# Patient Record
Sex: Female | Born: 1950 | Hispanic: Yes
Health system: Western US, Academic
[De-identification: ages and names within clinical notes are randomized; demographics above are authoritative.]

## PROBLEM LIST (undated history)

## (undated) DIAGNOSIS — Z87898 Personal history of other specified conditions: Secondary | ICD-10-CM

## (undated) DIAGNOSIS — E119 Type 2 diabetes mellitus without complications: Secondary | ICD-10-CM

## (undated) DIAGNOSIS — I10 Essential (primary) hypertension: Secondary | ICD-10-CM

## (undated) DIAGNOSIS — F259 Schizoaffective disorder, unspecified: Secondary | ICD-10-CM

## (undated) DIAGNOSIS — E7800 Pure hypercholesterolemia, unspecified: Secondary | ICD-10-CM

## (undated) HISTORY — DX: Anxiety disorder, unspecified: F41.9

## (undated) HISTORY — DX: Personal history of other specified conditions: Z87.898

## (undated) HISTORY — DX: Schizoaffective disorder, unspecified (CMS-HCC): F25.9

---

## 2014-07-24 ENCOUNTER — Emergency Department: Admission: EM | Admit: 2014-07-24 | Payer: Self-pay | Admitting: Emergency Medicine

## 2020-10-05 IMAGING — MR MRI LSPINE WO CONTRAST
5 of 6 series · 28 of 48 positions shown · non-contrast
Comparison: Radiographs dated 10/05/2020

HISTORY: Neuralgia, neuritis
TECHNIQUE: Routine multiplanar MRI of the lumbar spine was performed without IV contrast.

[Series 2: t2_cor · coronal · 4.0mm · 0.88mm/px · 6 of 16 slices shown]
[im 1/16]
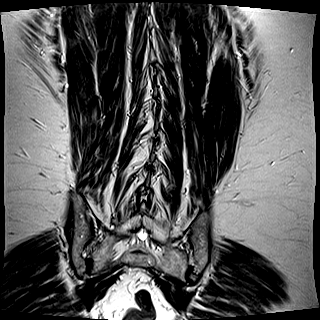
[im 4/16]
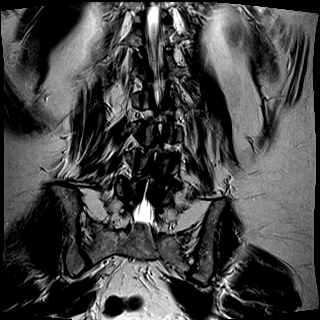
[im 7/16]
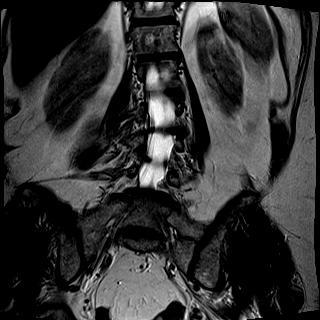
[im 10/16]
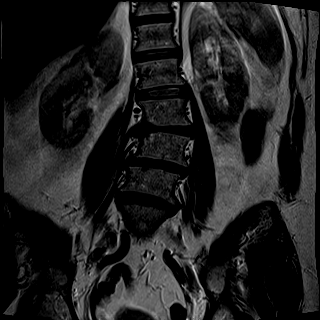
[im 13/16]
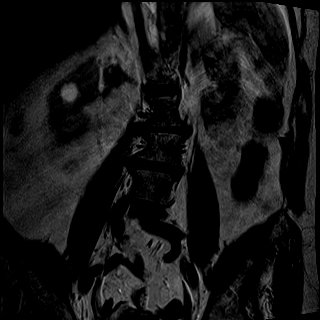
[im 16/16]
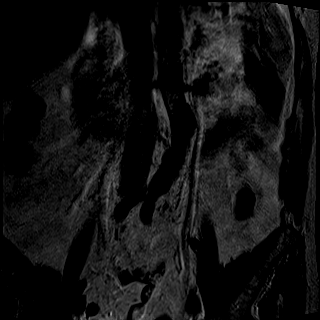

[Series 3: t2_sag · sagittal · 4.0mm · 0.68mm/px · 6 of 19 slices shown]
[im 1/19]
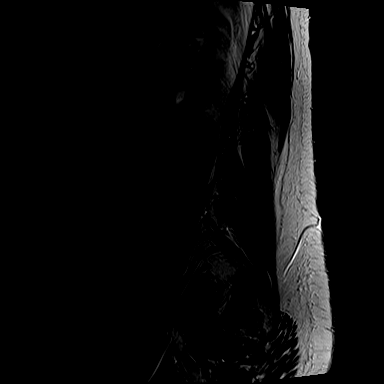
[im 4/19]
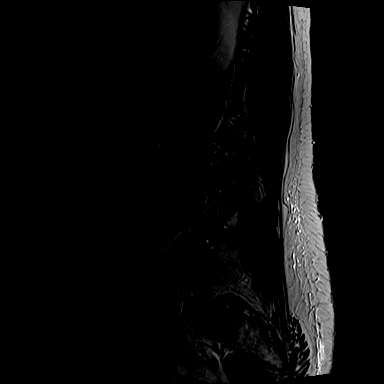
[im 8/19]
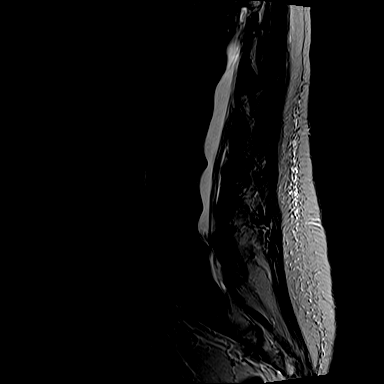
[im 11/19]
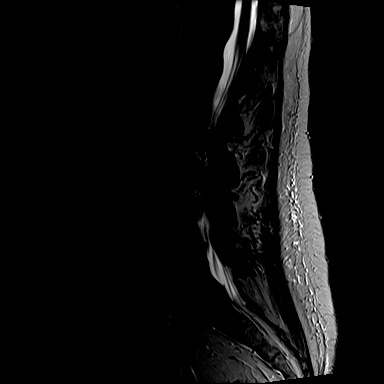
[im 15/19]
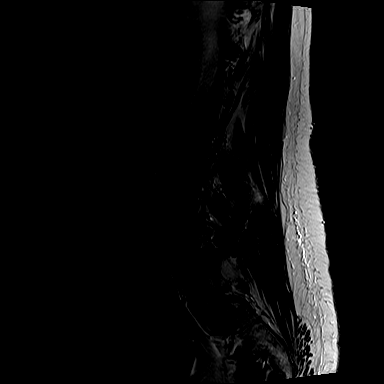
[im 19/19]
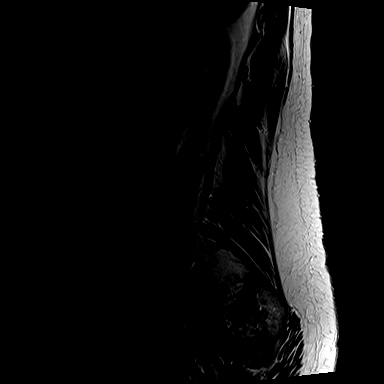

[Series 4: t1_sag · sagittal · 4.0mm · 0.81mm/px · 6 of 19 slices shown]
[im 1/19]
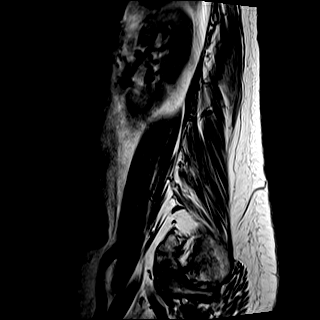
[im 4/19]
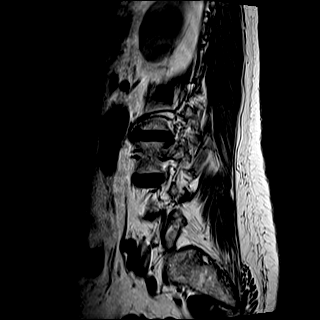
[im 8/19]
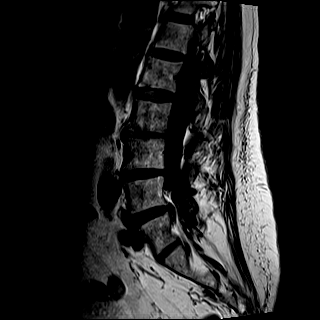
[im 11/19]
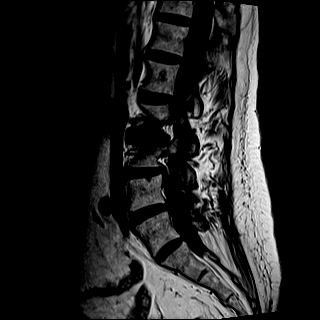
[im 15/19]
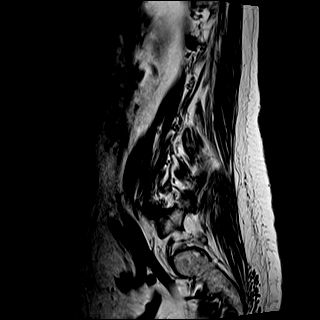
[im 19/19]
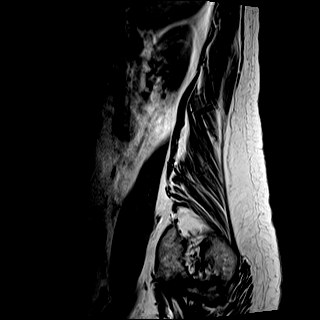

[Series 5: ir_sag · sagittal · 4.0mm · 0.51mm/px · 6 of 19 slices shown]
[im 1/19]
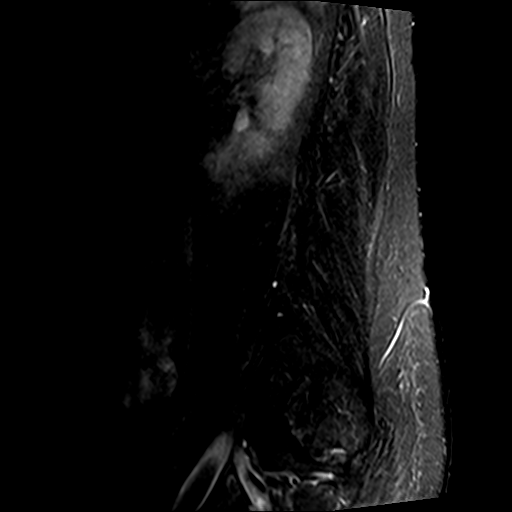
[im 4/19]
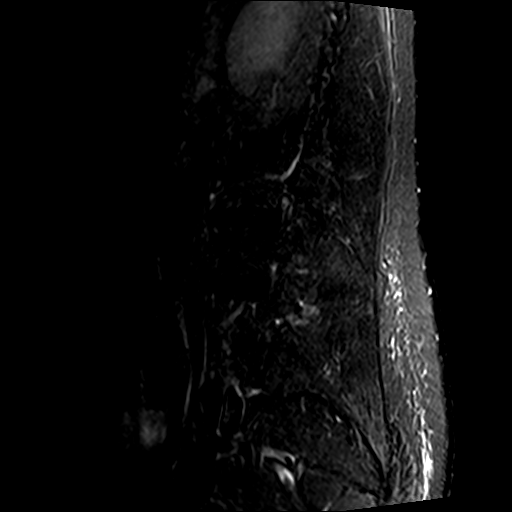
[im 8/19]
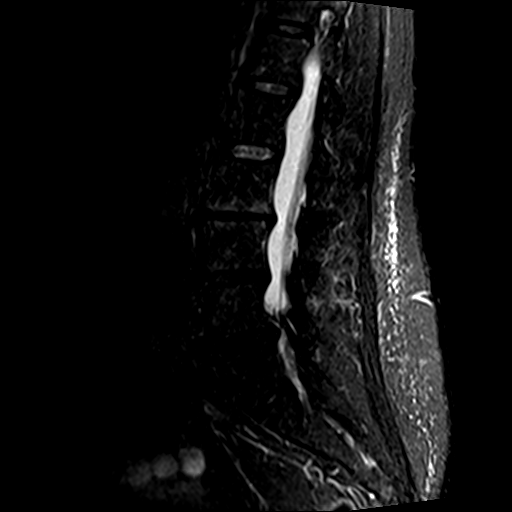
[im 11/19]
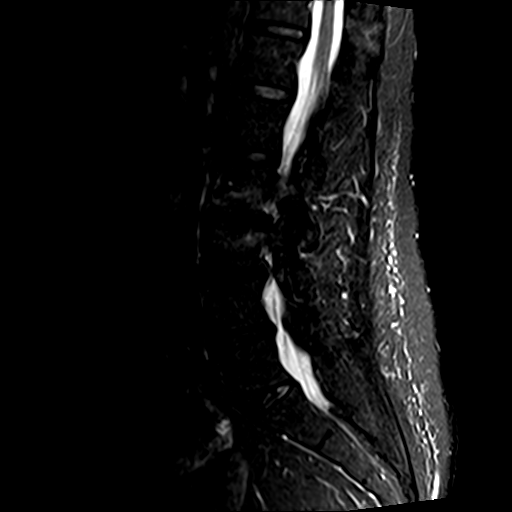
[im 15/19]
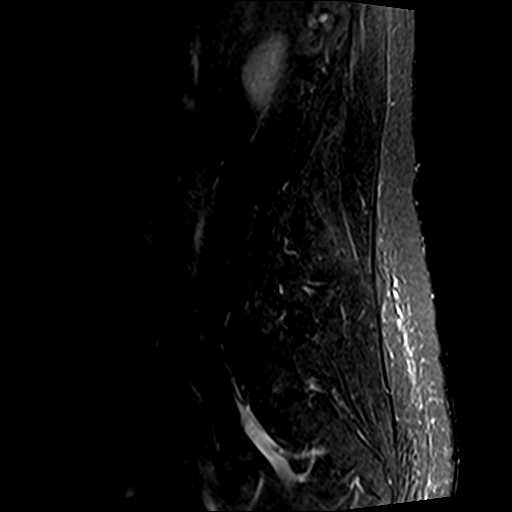
[im 19/19]
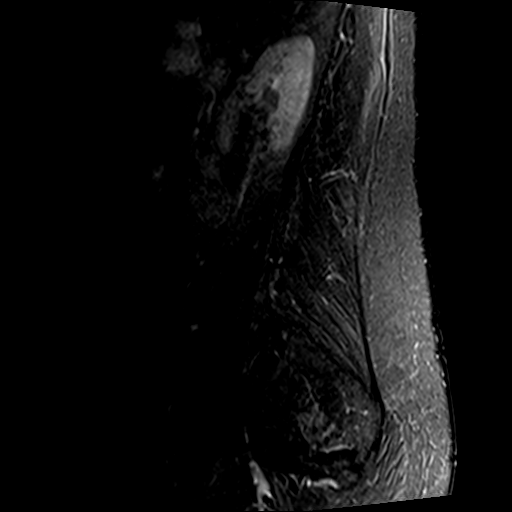

[Series 6: t2_axial · axial · 4.0mm · 0.39mm/px · z∈[-73,+17]mm · 4 of 41 slices shown]
[im 1/41]
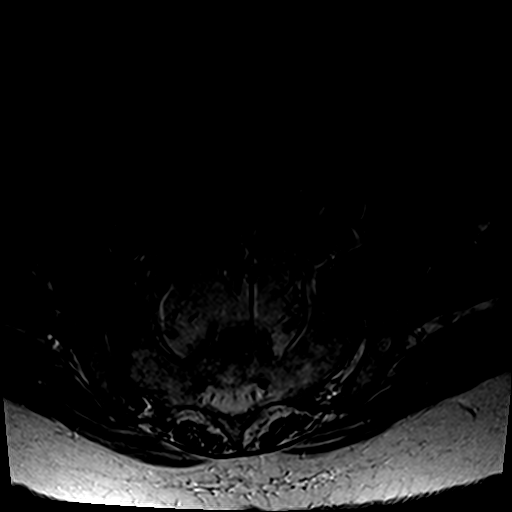
[im 7/41]
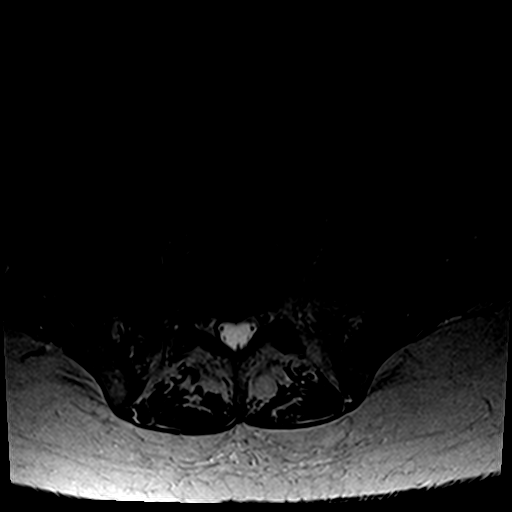
[im 13/41]
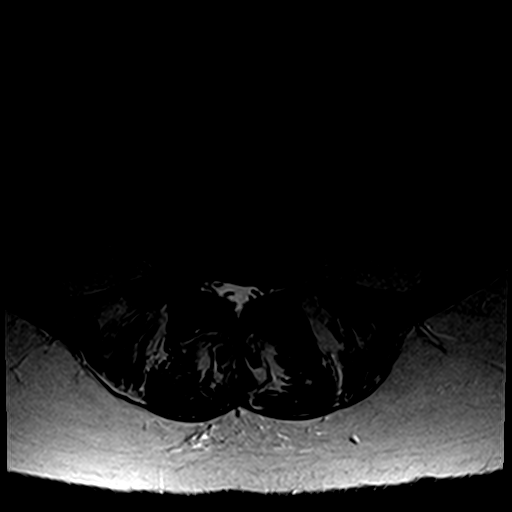
[im 19/41]
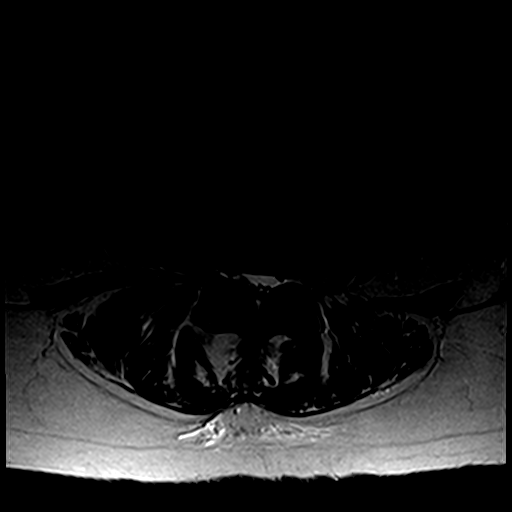

[28 of 48 positions shown; findings below may reference images not displayed]

FINDINGS: Review of radiographs demonstrates transitional lumbosacral anatomy. Partial sacralization of L5 with only rudimentary disc space between L5 and S1.

Localizer images demonstrate moderate lumbar levoscoliosis apex at L2-3. No anterolisthesis or retrolisthesis. No fracture or destructive lesion. There is multilevel degenerative disc desiccation, loss of disc space height and diffuse disc bulging. Degenerative endplate changes about the L2-3 disc space. No abnormal signal present in an intradural or paraspinous position. Conus medullaris terminates normally at L1 and maintains normal signal. Cauda equina is unremarkable.

T12-L1: Minimal disc bulge. Superimposed shallow protrusion measuring 2 mm in size. This does not produce significant canal stenosis. Mild facet arthropathy. No foraminal stenoses.

L1-2: Minimal disc bulge. Mild facet arthropathy. No canal or foraminal stenoses.

L2-3: Moderate disc bulge. Moderate facet arthropathy, more advanced on the right. Mild canal stenosis. Moderate right foraminal stenosis. Mild left foraminal stenosis.

L3-4: Moderate diffuse disc bulge. Moderate facet arthropathy. Moderate canal stenosis. Moderate bilateral foraminal stenoses.

L4-5: Moderate diffuse disc bulge, more prominent to the left of midline. Moderate facet arthropathy, more prominent on the left. Moderate canal stenosis. Mild to moderate right foraminal stenosis and moderate to severe left foraminal stenosis.

L5-S1: Rudimentary disc space. No disc bulge. No disc herniation. No canal stenosis. No foraminal stenoses.
IMPRESSION: 1. Partial sacralization of L5 with only a rudimentary disc space between L5 and S1 as discussed above.

2. Moderate lumbar levoscoliosis, apex at L2-3.

3. Multilevel degenerative disc disease and facet arthropathy most advanced at the L3-4 and L4-5 levels where moderate canal stenoses are present. Multilevel foraminal stenoses. Most notably, moderate to severe left foraminal stenosis at L4-5. Moderate bilateral foraminal stenoses at L3-4.

4. No fractures or destructive lesions.

## 2020-10-05 IMAGING — CR [HOSPITAL] L SPINE
1 series · 2 of 2 positions shown · non-contrast
Comparison: Previous abdomen x-rays November 20, 2006

INDICATION: MRI correlation
TECHNIQUE: Lumbar spine 2 views, No Charge; October 05, 2020

[Series 1: lat · 0.17mm/px · 2 of 2 slices shown]
[im 1/2]
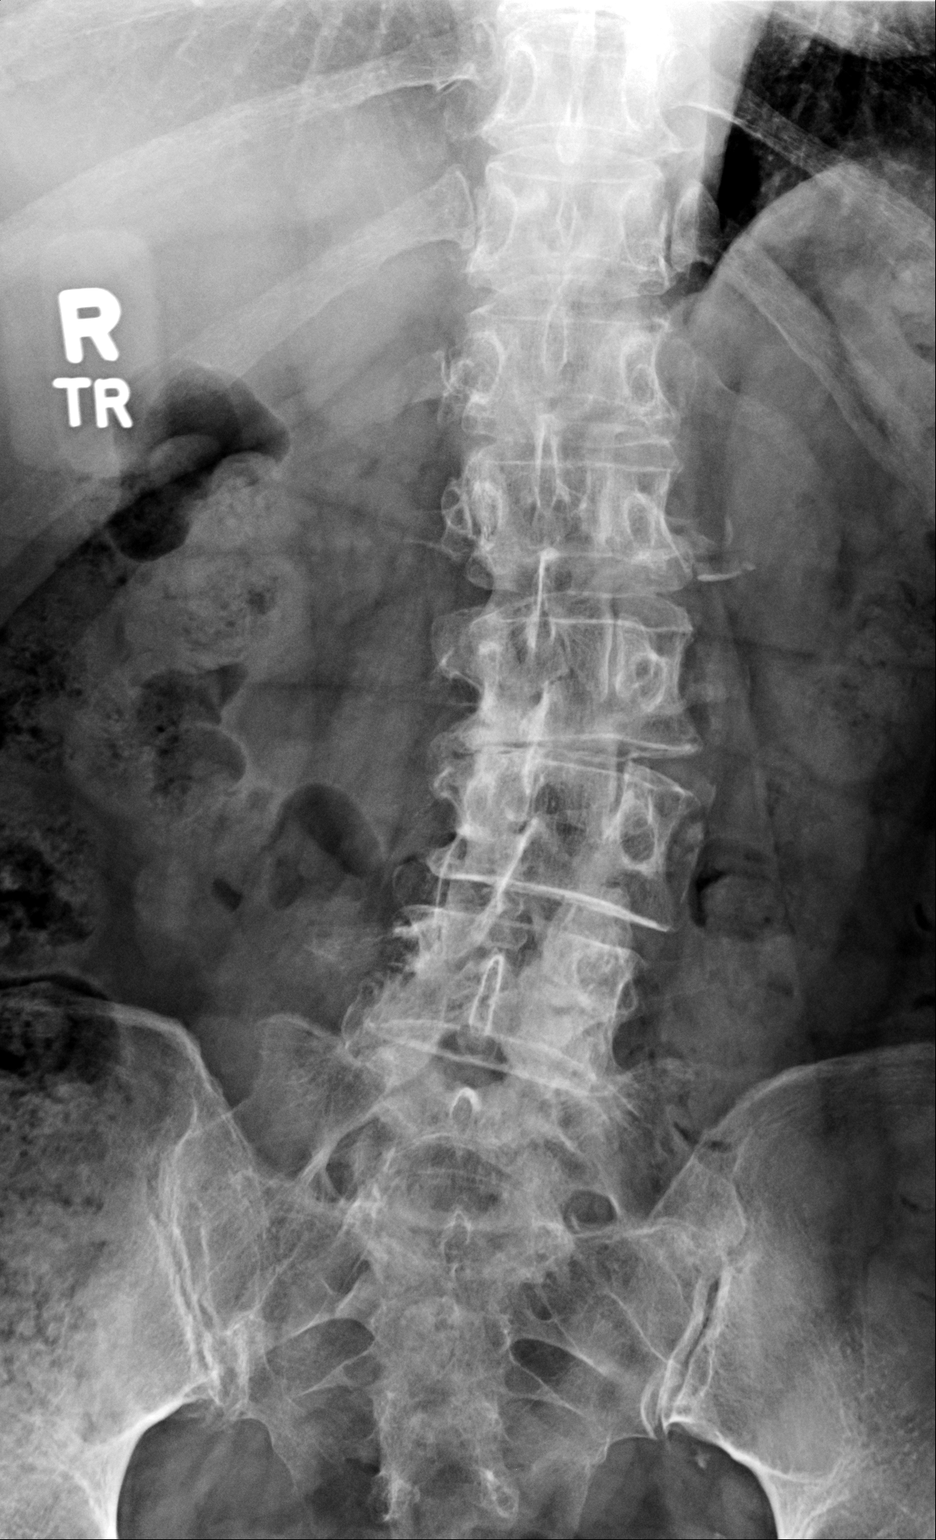
[im 2/2]
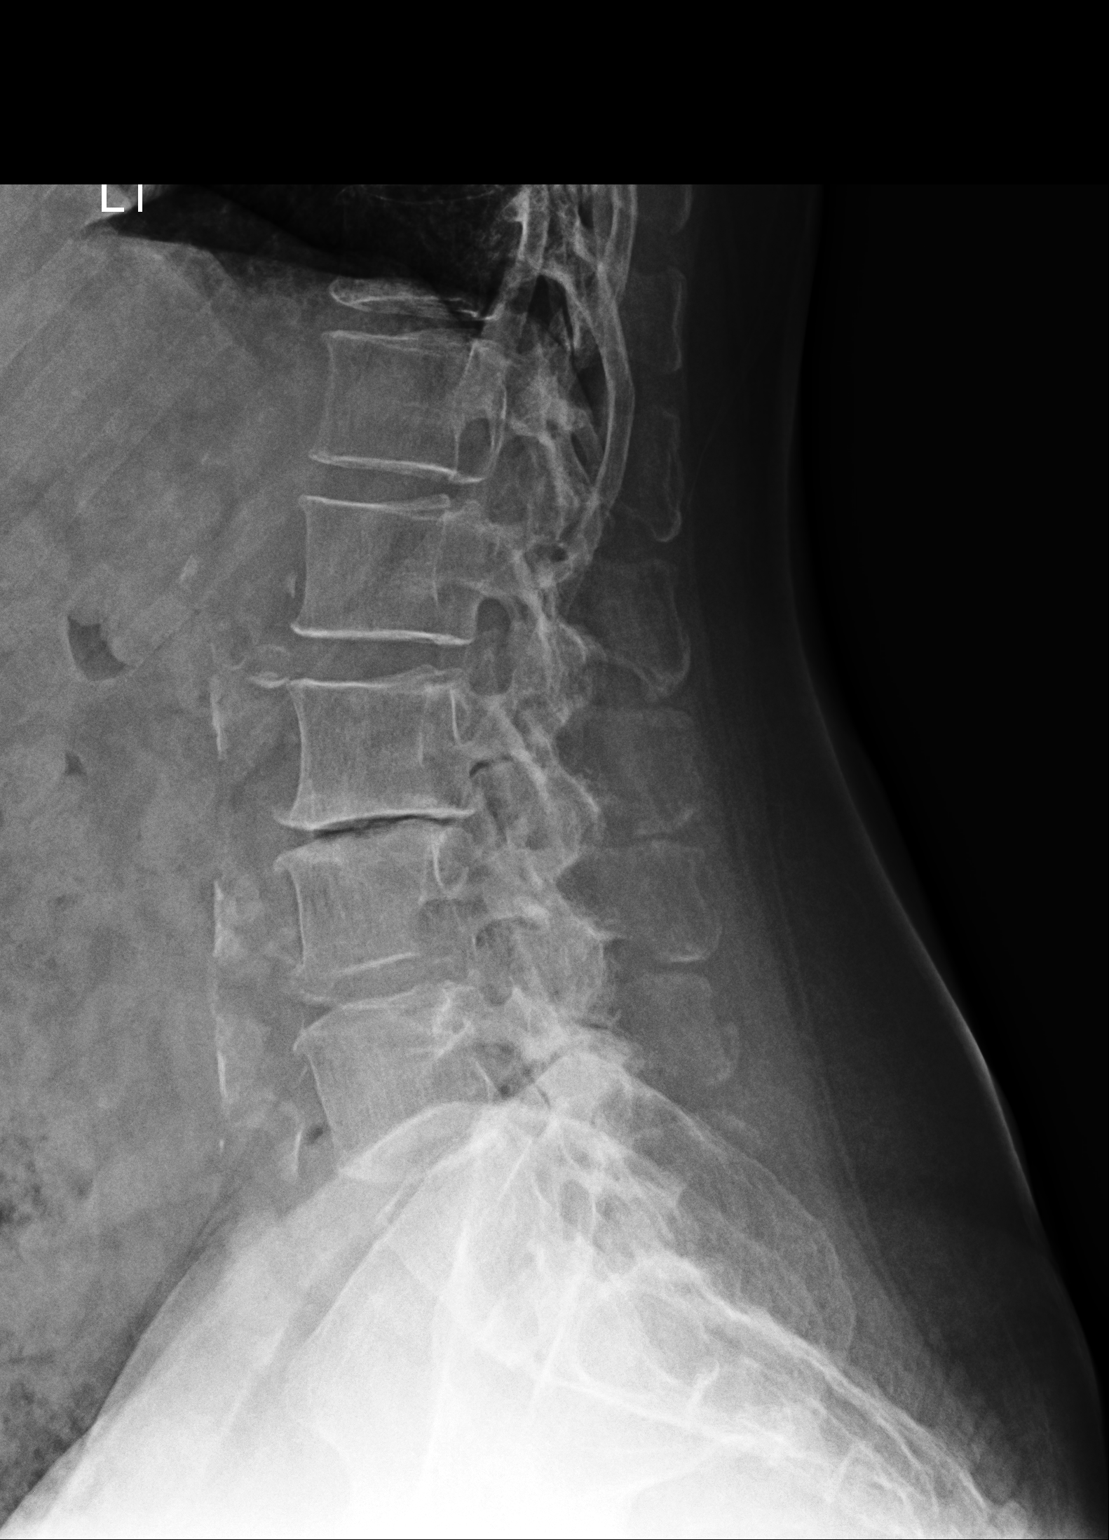

[2 of 2 positions shown; findings below may reference images not displayed]

FINDINGS: There are 4 lumbar-type vertebrae with sacralization of L5. Stable mild lumbar levoscoliosis is seen with apex of the curve at L2-L3. Generalized decreased bone density is seen with no compression fracture deformity of the lower thoracic and lumbar spine. Decrease in disc space is seen at L2-L3 with degenerative endplate changes in keeping with degenerative disc disease. Bilateral degenerative changes of the facets are seen from L2 down to L4-S1. Mild bilateral degenerative changes of the sacroiliac joints are seen. Abdominal aorta and iliac arteries are calcified with no aneurysm.
IMPRESSION: 1. 4 lumbar-type vertebrae. Stable mild lumbar levoscoliosis with the apex of the curve at L2-L3.

2. Degenerative disc disease at L2-L3 with bilateral degenerative changes of the mid to lower lumbar facets.

3. Mild degenerative changes of the sacroiliac joints.

4. Atherosclerotic vascular disease of the aorta and common iliac arteries with no aneurysm.

## 2021-08-23 IMAGING — MR MRI KNEE RT WO CONTRAST
5 series · 40 of 40 positions shown · non-contrast
Comparison: None available.

INDICATION: Internal derangement of right knee
TECHNIQUE: Multiplanar, multisequence imaging of the right knee was performed without contrast.

[Series 2: t2_axial_fs · axial · 4.0mm · 0.51mm/px · z∈[-75,+50]mm · 10 of 26 slices shown]
[im 1/26]
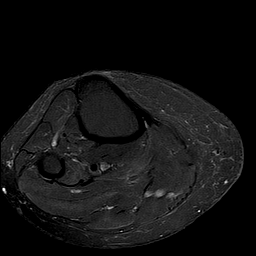
[im 3/26]
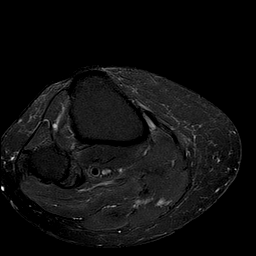
[im 6/26]
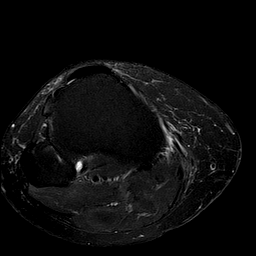
[im 9/26]
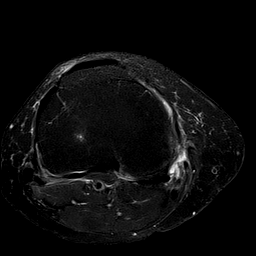
[im 12/26]
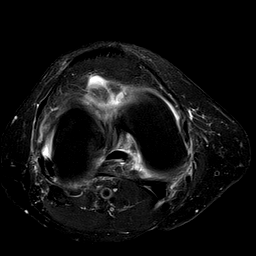
[im 14/26]
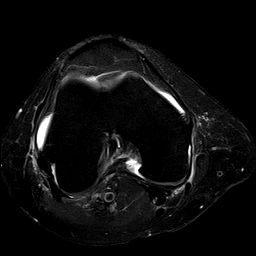
[im 17/26]
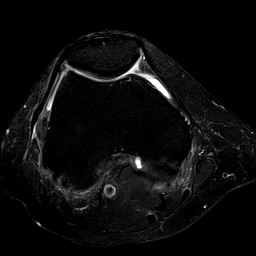
[im 20/26]
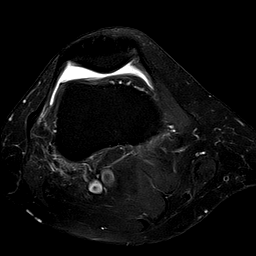
[im 23/26]
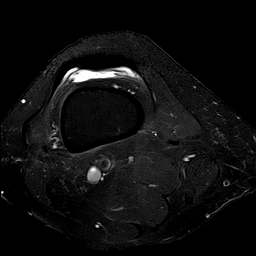
[im 26/26]
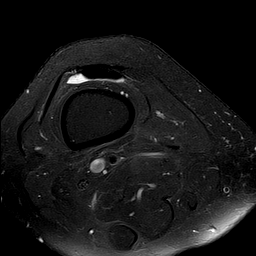

[Series 4: pd_sag_fs · sagittal · 3.0mm · 0.57mm/px · 8 of 24 slices shown]
[im 1/24]
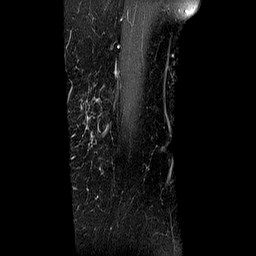
[im 4/24]
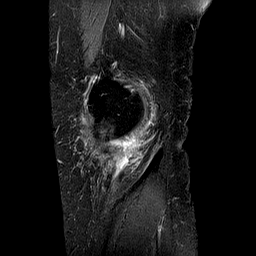
[im 7/24]
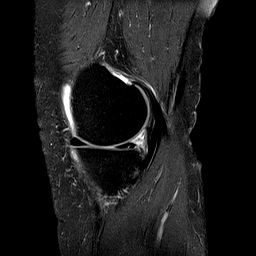
[im 10/24]
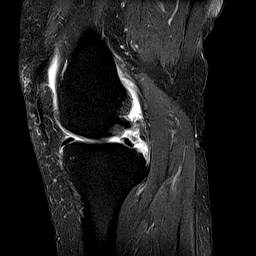
[im 14/24]
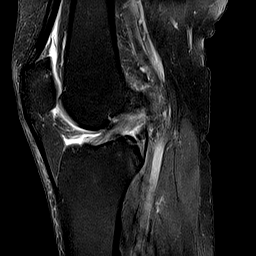
[im 17/24]
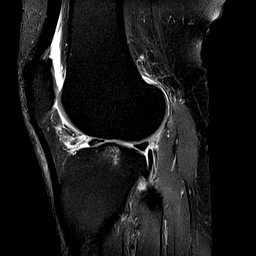
[im 20/24]
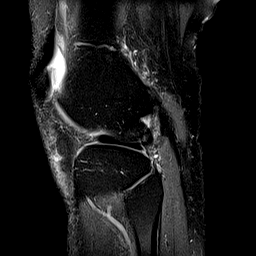
[im 24/24]
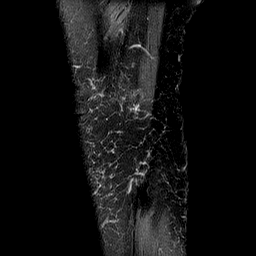

[Series 5: t2_sag_fs · sagittal · 3.0mm · 0.57mm/px · 8 of 24 slices shown]
[im 1/24]
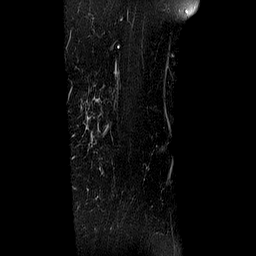
[im 4/24]
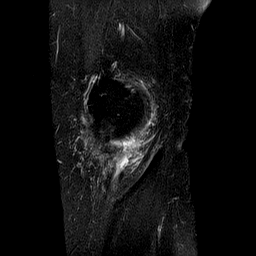
[im 7/24]
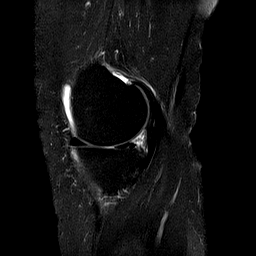
[im 10/24]
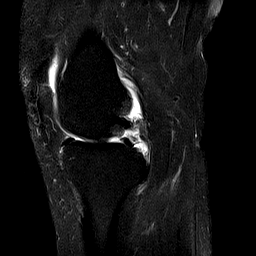
[im 14/24]
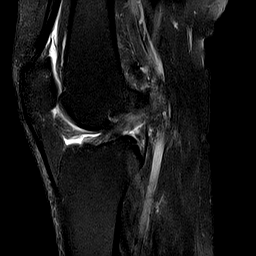
[im 17/24]
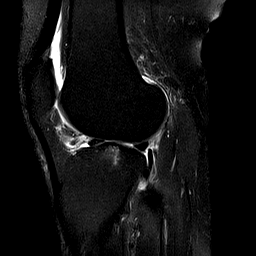
[im 20/24]
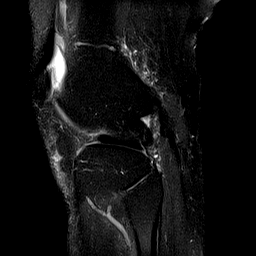
[im 24/24]
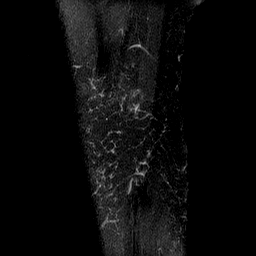

[Series 6: t1_cor · coronal · 4.0mm · 0.44mm/px · 7 of 20 slices shown]
[im 1/20]
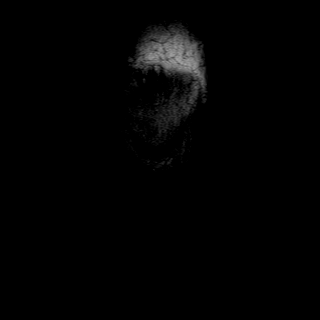
[im 4/20]
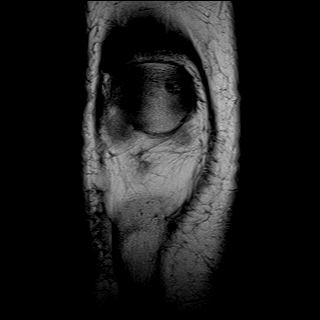
[im 7/20]
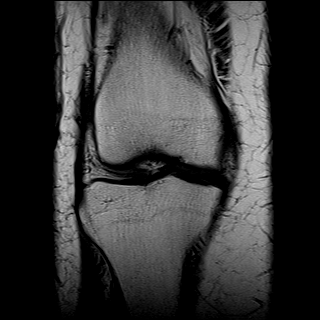
[im 10/20]
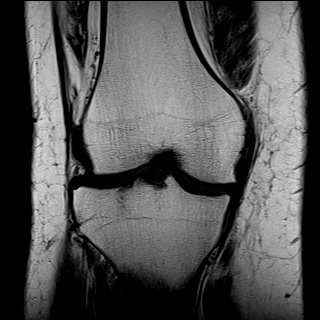
[im 13/20]
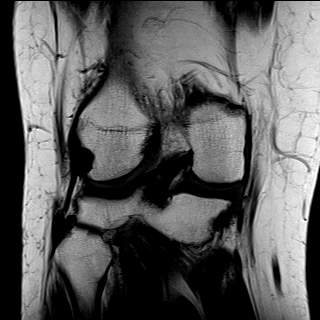
[im 16/20]
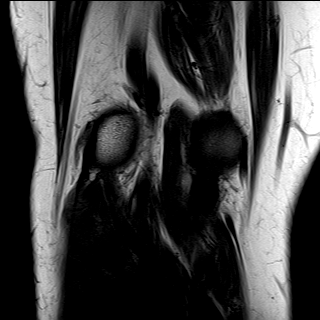
[im 20/20]
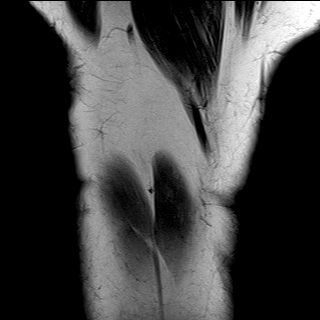

[Series 7: t2_cor_fs · coronal · 4.0mm · 0.44mm/px · 7 of 20 slices shown]
[im 1/20]
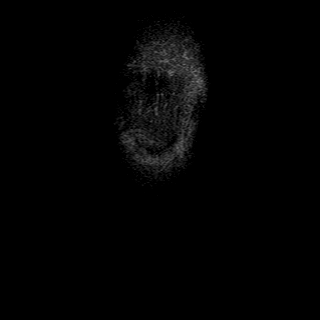
[im 4/20]
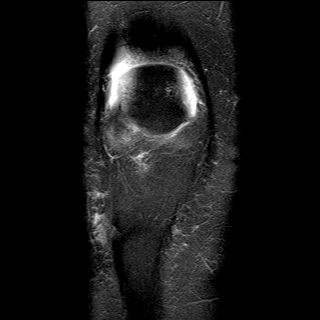
[im 7/20]
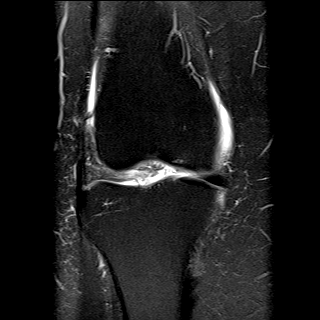
[im 10/20]
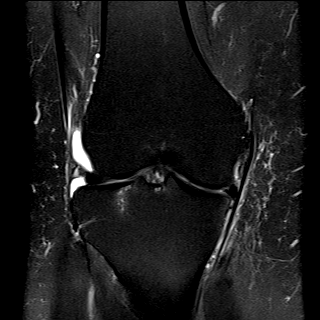
[im 13/20]
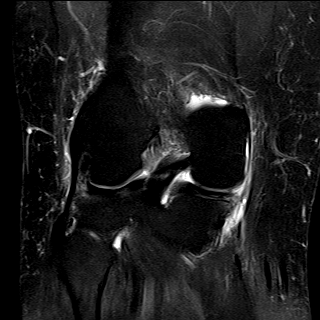
[im 16/20]
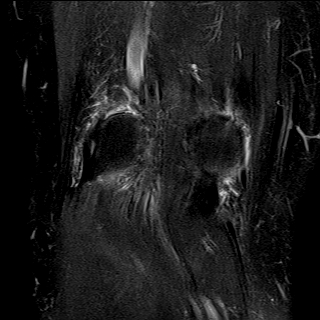
[im 20/20]
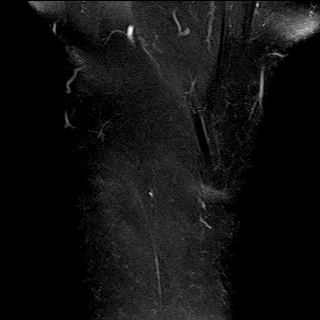

[40 of 40 positions shown; findings below may reference images not displayed]

FINDINGS: OSSEOUS: No acute fracture, avascular necrosis or aggressive osseous lesion.

MEDIAL JOINT COMPARTMENT:

Medial meniscus: Oblique inferior surface tearing of the posterior horn into the posterior body of the medial meniscus. Partial extrusion of the medial meniscal body, with a small to moderate-sized displaced inferior flap extending into the meniscotibial recess.

Articular cartilage: Scattered superficial partial-thickness chondral irregularity of the weightbearing medial femoral condyle and medial tibial plateau, with reactive subchondral marrow edema within the peripheral aspect of the medial tibial plateau, adjacent to the displaced inferior flap tear.

LATERAL JOINT COMPARTMENT:

Lateral meniscus: Intact.

Articular cartilage: Small full-thickness chondral fissures of the central weightbearing lateral tibial plateau, with mild underlying subchondral marrow edema.

PATELLOFEMORAL JOINT AND EXTENSOR MECHANISM:

Quadriceps tendon: The visualized portion of the distal quadriceps tendon is intact.

Patella tendon: Intact.

Articular cartilage: Deep partial-thickness chondral fissuring of the superior aspect of the median ridge of the patella, small focus of underlying subchondral marrow edema. Full-thickness chondral fissuring of the inferior portion of the medial femoral trochlea, with small focus of underlying subchondral marrow edema. Superficial partial-thickness chondrosis of the mid trochlear groove, without underlying reactive osseous change.

Alignment: The alignment of the patellofemoral joint is within normal limits, in the imaged position.

LIGAMENTS:

Anterior cruciate ligament: Intact.

Posterior cruciate ligament: Intact.

Medial collateral ligament: Intact.

Lateral collateral ligament: Intact.

MUSCULOTENDINOUS: The visualized musculotendinous soft tissues about the knee are unremarkable.

OTHER: Small to moderate knee joint effusion.
IMPRESSION: 1.
Oblique inferior surface tear of the posterior horn extending into the posterior body of the medial meniscus, with a displaced inferior flap extending into the meniscotibial recess. Mild to moderate medial compartment chondrosis.

2.
Mild to moderate patellofemoral joint chondrosis.

3.
Mild lateral compartment chondrosis.

## 2022-02-13 IMAGING — OT DXA BONE DENSITY
3 series · 3 of 3 positions shown · non-contrast
Comparison: none

REASON FOR EXAM: Post-menopausal osteoporosis screening.

RISK FACTORS:  Personal history of left wrist fracture requiring surgery.
PRIOR EXAMS:  None.
METHOD:  Scans of the lumbar spine, left hip, and right forearm were performed using dual energy X-ray densitometry (DXA)

[Series 5: — · right · 1 of 1 slices shown (1 of 3)]
[im 1/1]
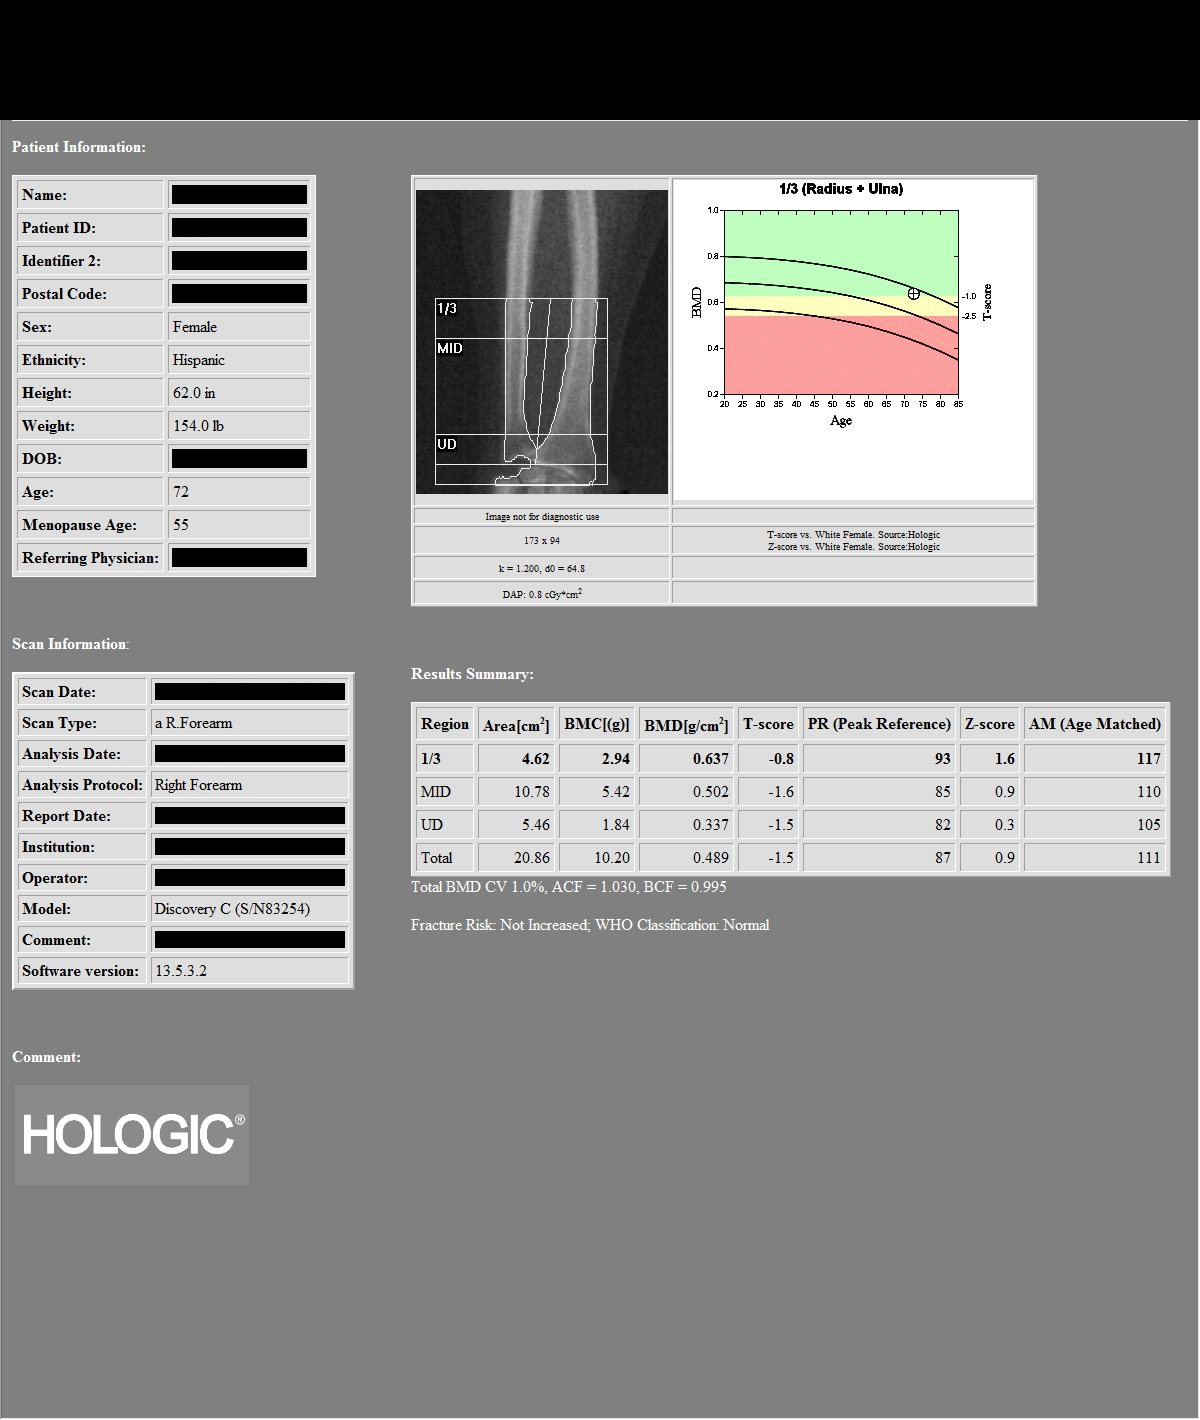

[Series 6: — · 1 of 1 slices shown (2 of 3)]
[im 1/1]
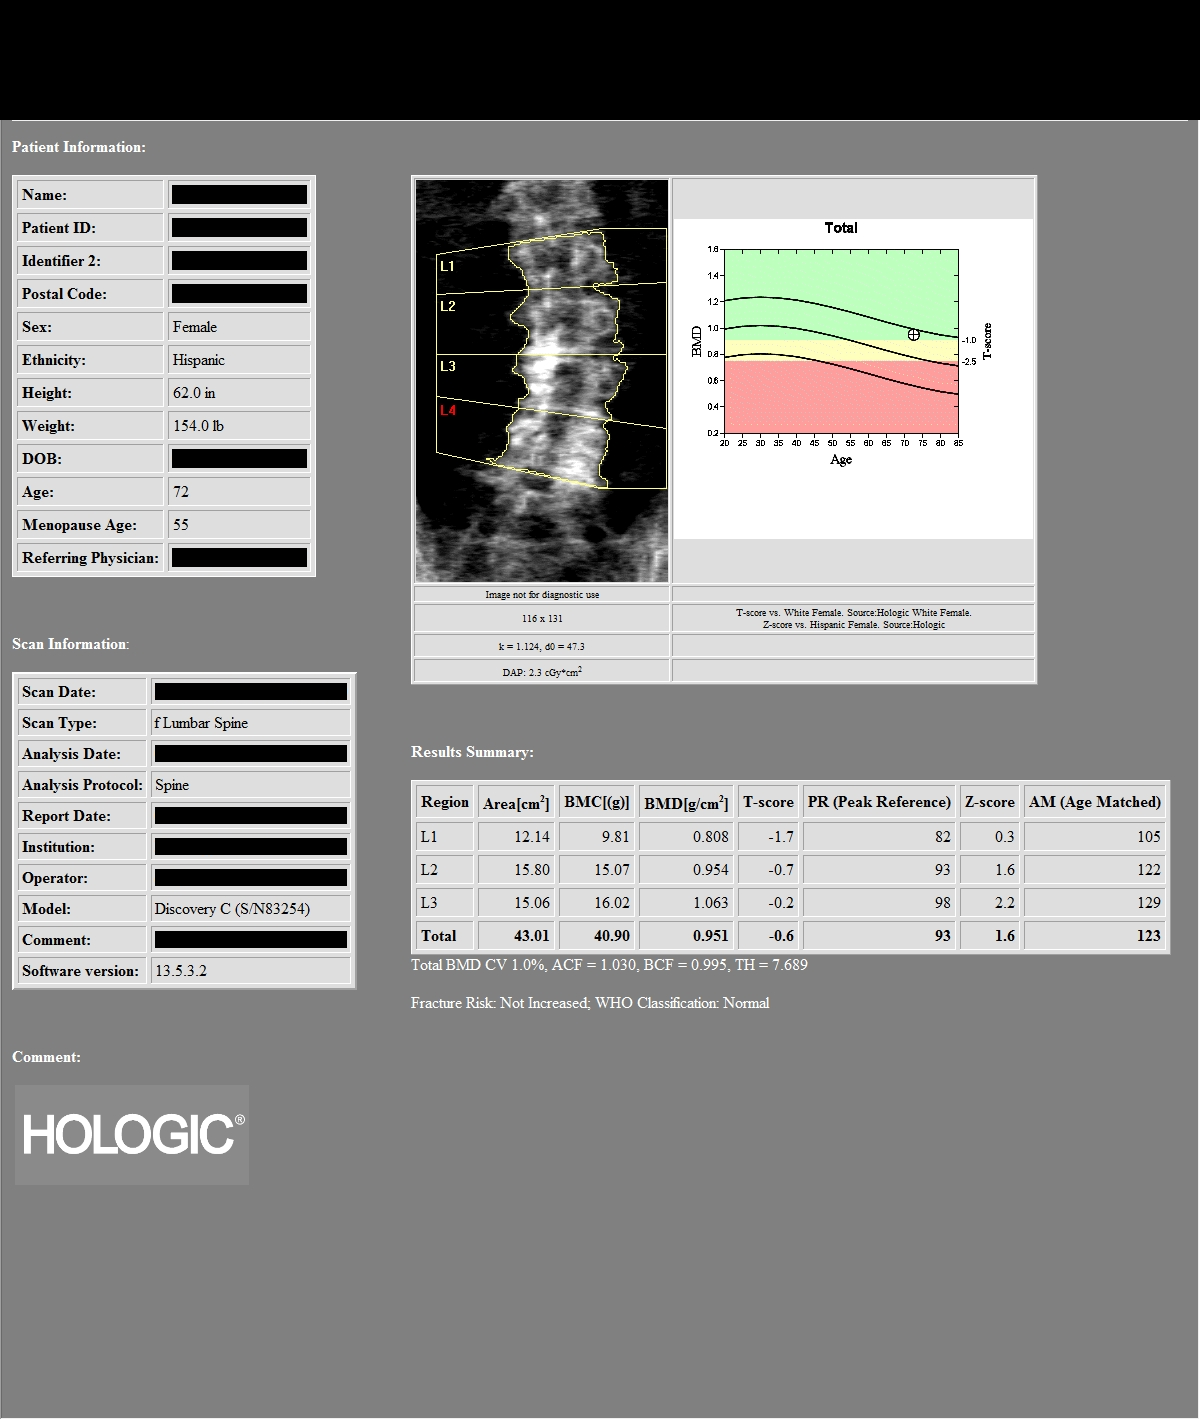

[Series 7: — · left · 1 of 1 slices shown (3 of 3)]
[im 1/1]
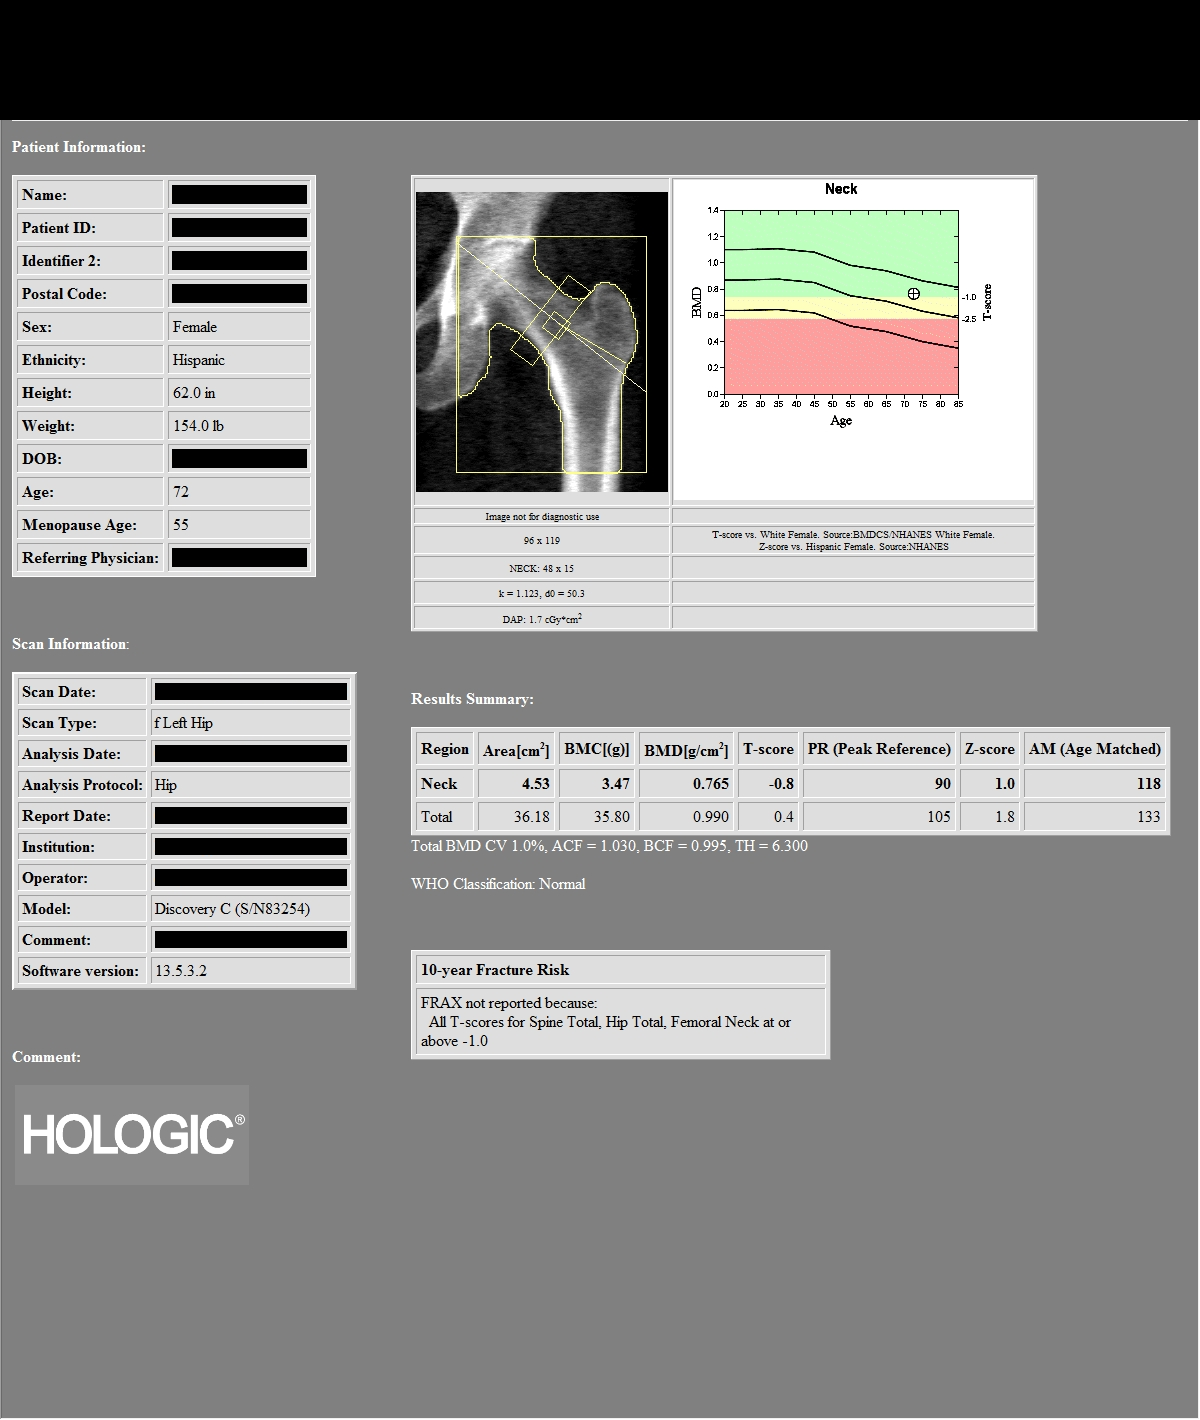

[3 of 3 positions shown; findings below may reference images not displayed]

IMPRESSION: As defined by World Health Organization, the patient meets the criteria for NORMAL BONE DENSITY based on all T-scores.

PATIENT DEMOGRAPHICS:  70-year-old Hispanic female.
FINDINGS: 1.    Review of scanogram images shows levoscoliosis and extensive discogenic endplate sclerosis and posterior element hypertrophy. L4 is excluded due to greater than 1 standard deviation compared to L3.  

2.    The lumbar spine exam using L1-L3 regions shows average Bone Mineral Density is 0.951 gm/cm2 of Hydroxyapatite.  The T-score (comparing patient with a young adult group) is 0.6 standard deviations BELOW mean. The Z-score (comparing patient with an age-matched group) is 1.6 standard deviations ABOVE mean.

3.  The left hip exam using femoral neck region of interest shows average Bone Mineral Density is 0.765 gm/cm2 of Hydroxyapatite. The T-score (comparing patient with a young adult group) is 0.8 standard deviations BELOW mean. The Z-score (comparing patient with an age-matched group) is 1.0 standard deviations ABOVE mean.

4.   The right forearm exam using one-third radius region of interest shows average Bone Mineral Density is 0.637 gm/cm2 of Hydroxyapatite. The T-score (comparing patient with a young adult group) is 0.8 standard deviations BELOW mean. The Z-score (comparing patient with an age-matched group) is 1.6 standard deviations ABOVE mean.

RECOMMENDATIONS:  The patient states that she is taking supplements on a regular basis.  The patient should continue being a non-smoker and regular exercise to patient tolerance would be of benefit.  The patient is currently not taking prescribed medication for prevention of bone loss.  According to criteria established by the National Osteoporosis Foundation, THE PATIENT DOES NOT MEET THE CURRENT INDICATIONS FOR PRESCRIBED MEDICAL THERAPY.

World Health Organization criteria for diagnosis, please see link below.

[URL]

## 2023-01-09 ENCOUNTER — Emergency Department: Payer: No Typology Code available for payment source

## 2023-01-09 ENCOUNTER — Emergency Department
Admission: EM | Admit: 2023-01-09 | Discharge: 2023-01-10 | Disposition: A | Discharge status: Other Acute Inpatient Hospital | Payer: No Typology Code available for payment source | Attending: Emergency Medicine | Admitting: Emergency Medicine

## 2023-01-09 DIAGNOSIS — E119 Type 2 diabetes mellitus without complications: Secondary | ICD-10-CM | POA: Insufficient documentation

## 2023-01-09 DIAGNOSIS — Z1152 Encounter for screening for COVID-19: Secondary | ICD-10-CM | POA: Insufficient documentation

## 2023-01-09 DIAGNOSIS — G9389 Other specified disorders of brain: Secondary | ICD-10-CM | POA: Insufficient documentation

## 2023-01-09 DIAGNOSIS — Z8679 Personal history of other diseases of the circulatory system: Secondary | ICD-10-CM

## 2023-01-09 DIAGNOSIS — I2510 Atherosclerotic heart disease of native coronary artery without angina pectoris: Secondary | ICD-10-CM

## 2023-01-09 DIAGNOSIS — F23 Brief psychotic disorder: Secondary | ICD-10-CM | POA: Insufficient documentation

## 2023-01-09 DIAGNOSIS — I10 Essential (primary) hypertension: Secondary | ICD-10-CM | POA: Insufficient documentation

## 2023-01-09 DIAGNOSIS — F22 Delusional disorders: Secondary | ICD-10-CM | POA: Insufficient documentation

## 2023-01-09 DIAGNOSIS — E7800 Pure hypercholesterolemia, unspecified: Secondary | ICD-10-CM | POA: Insufficient documentation

## 2023-01-09 DIAGNOSIS — F29 Unspecified psychosis not due to a substance or known physiological condition: Secondary | ICD-10-CM

## 2023-01-09 DIAGNOSIS — R4182 Altered mental status, unspecified: Secondary | ICD-10-CM

## 2023-01-09 HISTORY — DX: Essential (primary) hypertension: I10

## 2023-01-09 HISTORY — DX: Type 2 diabetes mellitus without complications (CMS-HCC): E11.9

## 2023-01-09 HISTORY — DX: Pure hypercholesterolemia, unspecified: E78.00

## 2023-01-09 LAB — COMPREHENSIVE METABOLIC PANEL, BLOOD
ALT: 6 U/L — ABNORMAL LOW (ref 7–52)
AST: 16 U/L (ref 13–39)
Albumin: 4.3 G/DL (ref 3.7–5.3)
Alk Phos: 50 U/L (ref 34–104)
BUN: 13 mg/dL (ref 7–25)
Bilirubin, Total: 0.8 mg/dL (ref 0.0–1.4)
CO2: 18 mmol/L — ABNORMAL LOW (ref 21–31)
Calcium: 9.3 mg/dL (ref 8.6–10.3)
Chloride: 103 mmol/L (ref 98–107)
Creat: 0.5 mg/dL — ABNORMAL LOW (ref 0.6–1.2)
Electrolyte Balance: 14 mmol/L — ABNORMAL HIGH (ref 2–12)
Glucose: 110 mg/dL (ref 85–125)
Potassium: 3.5 mmol/L (ref 3.5–5.1)
Protein, Total: 7.1 G/DL (ref 6.0–8.3)
Sodium: 135 mmol/L — ABNORMAL LOW (ref 136–145)
eGFR - high estimate: >60 (ref >59)
eGFR - low estimate: >60 (ref >59)

## 2023-01-09 LAB — CBC WITH DIFF, BLOOD
ANC automated: 5.2 10*3/uL (ref 2.0–8.1)
Basophils %: 0.5 %
Basophils Absolute: 0 10*3/uL (ref 0.0–0.2)
Eosinophils %: 0 %
Eosinophils Absolute: 0 10*3/uL (ref 0.0–0.5)
Hematocrit: 47.7 % — ABNORMAL HIGH (ref 34.0–44.0)
Hgb: 15.6 G/DL — ABNORMAL HIGH (ref 11.5–15.0)
Lymphocytes %: 16 %
Lymphocytes Absolute: 1.1 10*3/uL (ref 0.9–3.3)
MCH: 30 PG (ref 27.0–33.5)
MCHC: 32.6 G/DL (ref 32.0–35.5)
MCV: 92 FL (ref 81.5–97.0)
MPV: 11.7 FL (ref 7.2–11.7)
Monocytes %: 7.8 %
Monocytes Absolute: 0.5 10*3/uL (ref 0.0–0.8)
Neutrophils % (A): 75.7 %
PLT Count: 129 10*3/uL — ABNORMAL LOW (ref 150–400)
RBC: 5.18 10*6/uL — ABNORMAL HIGH (ref 3.70–5.00)
RDW-CV: 14.3 % (ref 11.6–14.4)
White Bld Cell Count: 6.9 10*3/uL (ref 4.0–10.5)

## 2023-01-09 LAB — URINALYSIS
Bilirubin, UA: NEGATIVE
Glucose, UA: NEGATIVE MG/DL
Hemoglobin, UA: NEGATIVE
Ketones, UA: 40 MG/DL — AB
Nitrite, UA: NEGATIVE
Protein, UA: NEGATIVE MG/DL
RBC, UA: 2 #/HPF (ref 0–3)
Specific Grav, UA: 1.02 (ref 1.003–1.030)
Squamous Epithelial, UA: 1 /HPF (ref 0–10)
Urobilinogen, UA: <2 MG/DL (ref <2)
WBC, UA: <1 #/HPF (ref 0–5)
pH, UA: 6.5 (ref 5.0–8.0)

## 2023-01-09 LAB — DRUG SCREEN RAPID PANEL 12 NO CONFIRMATION, URINE
Amphetamines, Urine: NOT DETECTED
Barbiturates: NOT DETECTED
Benzodiazepines: NOT DETECTED
Cocaine: NOT DETECTED
Fentanyl,  U Scrn: NOT DETECTED
MDMA: NOT DETECTED
Methadone: NOT DETECTED
Opiates: NOT DETECTED
Oxycodone Lvl, UR: NOT DETECTED
PCP: NOT DETECTED
Propoxyphene: NOT DETECTED
THC: NOT DETECTED

## 2023-01-09 LAB — CORONAVIRUS DISEASE 2019 (COVID-19) SCOVS
COVID-19 Comment: NOT DETECTED
COVID-19 Result: NOT DETECTED

## 2023-01-09 MED ORDER — VALSARTAN 40 MG OR TABS: 40.0000 mg | ORAL_TABLET | Freq: Every day | ORAL | Status: AC

## 2023-01-09 MED ORDER — SODIUM CHLORIDE 0.9 % IV BOLUS (~~LOC~~)
1000.0000 mL | INJECTION | Freq: Once | INTRAVENOUS | Status: AC
Start: 2023-01-09 — End: 2023-01-09
  Administered 2023-01-09: 1000 mL via INTRAVENOUS

## 2023-01-09 MED ORDER — HALOPERIDOL 0.5 MG OR TABS
0.5000 mg | ORAL_TABLET | Freq: Two times a day (BID) | ORAL | Status: DC | PRN
Start: 2023-01-09 — End: 2023-01-10
  Administered 2023-01-09 – 2023-01-10 (×2): 0.5 mg via ORAL
  Filled 2023-01-09 (×4): qty 1

## 2023-01-09 MED ORDER — HYDROXYZINE HCL 25 MG OR TABS
25.0000 mg | ORAL_TABLET | Freq: Two times a day (BID) | ORAL | Status: DC | PRN
Start: 2023-01-09 — End: 2023-01-10
  Administered 2023-01-09 – 2023-01-10 (×2): 25 mg via ORAL
  Filled 2023-01-09 (×2): qty 1

## 2023-01-09 MED ORDER — SIMVASTATIN 10 MG OR TABS: 10.0000 mg | ORAL_TABLET | Freq: Every evening | ORAL | Status: AC

## 2023-01-09 NOTE — ED Notes (Addendum)
Assumed care of pt. Pt BIB sister d/t episodes of psychosis, agitation, and poor decisions. Pt states "my teeth are not mine, the top are not the same as the bottom, I bought these glasses but I don't remember the price. Maybe my teeth are designer like my glasses." +AH/-VH, denies SI at this time. Pt a&ox3 GCS14, breathing easy and unlabored, no acute s/s of distress noted. Will continue to monitor.

## 2023-01-09 NOTE — Consults (Signed)
Please refer to consults note written 2/1

## 2023-01-09 NOTE — ED Notes (Signed)
Sister Janelle Floor [phone]

## 2023-01-09 NOTE — ED Notes (Signed)
Patient sitting up on gurney no acute s/s of distress noted at  this time . Rr even and unlabored
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
## 2023-01-09 NOTE — ED Notes (Signed)
Pt to [facility] ; transported by lpt and pso

## 2023-01-09 NOTE — Event / Update (Signed)
Patient is not high risk for suicide and does not require 1:1 sitter

## 2023-01-09 NOTE — ED Notes (Signed)
Bed: 23  Expected date:   Expected time:   Means of arrival:   Comments:  H-1

## 2023-01-09 NOTE — ED Notes (Signed)
Patient reports having AH and jumping from one thing to another very unorganized . Patient states" I'm anxious , is there anythign else I can take". Rn medicated per prn medication ; please see mar . Patient is cooperative ; rr even and unlabored at this time. Ivf running

## 2023-01-09 NOTE — ED Notes (Signed)
Medically cleared per langdorf ; okay for pt to go to [facility] per MD Langdorf .    Report given to johnny rn for continuation of care
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
## 2023-01-10 NOTE — ED Notes (Signed)
Per DR wang, ok to give PRN atarax

## 2023-01-10 NOTE — Interdisciplinary (Signed)
PROGRESS NOTE    CSW utilized chart review for information. Patient noted to be medically cleared. Please refer to previous CSW note for more information. CSW initiated bed search for inpatient psych placement and faxed referral packet via [employer] to the following facilities:     [facility] [phone], [phone] (fax); Packet faxed for clinical review   [facility] [phone], [phone] (fax); s/w  [facility] [phone]) or [phone]; No answer.  [facility] ([place], [place], and [facility]) [phone]; s/w   [facility] [phone], [phone] (fax); Packet faxed for clinical review   [facility] ([place]) [phone]; Patient not appropriate for referral. [facility] (Tel: [phone]; Fax: [phone]); Packet faxed for clinical review   [facility] (Tel: [phone] #3; Fax: [phone]); Packet faxed for clinical review  [facility] [phone], [phone] (fax); No answer    Plan: Patient remains in the ED pending inpatient psych placement.

## 2023-01-10 NOTE — ED Notes (Signed)
Assumed care of pt at this time. Received pt laying down in bed and covered in blankets. Pt under no acute distress with respiration rate normal equal and unlabored.

## 2023-01-10 NOTE — ED Notes (Signed)
Report given to Raven at [facility]

## 2023-01-10 NOTE — ED Notes (Signed)
Covering primary nurse for break, patient is resting with eyes open, no acute distress noted. Respiration even and unlabored. Continue q87min visual in process, continue video monitoring in process.

## 2023-01-10 NOTE — Interdisciplinary (Signed)
01/10/23 0759   Assessment   Assessment Type Discharge;Face to Face   Referral Information   Referral Type Discharge Planning     CSW spoke with Pay[place] at [facility] ad pt is accepted by Dr. Ronnald Ramp to unit 5. CSW prepared transfer packet and transfer form with md signing. [facility] will arrive at 9:30 to transport pt. RN can assist in notifying family.  V Kallista Pae, LCSW

## 2023-01-10 NOTE — ED Notes (Signed)
Patient was asking for water to drink. Patient was provided with water.
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
## 2023-01-10 NOTE — ED Notes (Signed)
Pt is back in bed, laying down and covered in blankets. Pt under no acute distress.

## 2023-01-10 NOTE — ED Notes (Signed)
EMS transport at bedside for Lori Santos, all belongings and 5150 hold transferred with pt. Pt stable for transfer to [facility].

## 2023-01-10 NOTE — ED Notes (Signed)
Pt currently having racing thoughts but is redirectable.
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
## 2023-06-18 ENCOUNTER — Emergency Department
Admission: EM | Admit: 2023-06-18 | Discharge: 2023-06-19 | Disposition: A | Discharge status: Home / Self Care | Payer: No Typology Code available for payment source | Attending: Emergency Medicine | Admitting: Emergency Medicine

## 2023-06-18 ENCOUNTER — Emergency Department: Payer: No Typology Code available for payment source

## 2023-06-18 DIAGNOSIS — I10 Essential (primary) hypertension: Secondary | ICD-10-CM | POA: Insufficient documentation

## 2023-06-18 DIAGNOSIS — K5900 Constipation, unspecified: Secondary | ICD-10-CM

## 2023-06-18 DIAGNOSIS — B9620 Unspecified Escherichia coli [E. coli] as the cause of diseases classified elsewhere: Secondary | ICD-10-CM | POA: Insufficient documentation

## 2023-06-18 DIAGNOSIS — R7303 Prediabetes: Secondary | ICD-10-CM | POA: Insufficient documentation

## 2023-06-18 DIAGNOSIS — K5641 Fecal impaction: Secondary | ICD-10-CM | POA: Insufficient documentation

## 2023-06-18 DIAGNOSIS — K6289 Other specified diseases of anus and rectum: Secondary | ICD-10-CM

## 2023-06-18 DIAGNOSIS — F259 Schizoaffective disorder, unspecified: Secondary | ICD-10-CM | POA: Insufficient documentation

## 2023-06-18 DIAGNOSIS — R319 Hematuria, unspecified: Secondary | ICD-10-CM

## 2023-06-18 DIAGNOSIS — R935 Abnormal findings on diagnostic imaging of other abdominal regions, including retroperitoneum: Secondary | ICD-10-CM

## 2023-06-18 DIAGNOSIS — N390 Urinary tract infection, site not specified: Secondary | ICD-10-CM | POA: Insufficient documentation

## 2023-06-18 DIAGNOSIS — Z91148 Patient's other noncompliance with medication regimen for other reason: Secondary | ICD-10-CM | POA: Insufficient documentation

## 2023-06-18 MED ORDER — LACTATED RINGERS IV BOLUS (~~LOC~~)
1000.0000 mL | Freq: Once | INTRAVENOUS | Status: AC
Start: 2023-06-18 — End: 2023-06-19
  Administered 2023-06-19: 1000 mL via INTRAVENOUS

## 2023-06-18 NOTE — ED Provider Notes (Signed)
CHIEF COMPLAINT:  Dysuria (Pt was diagnosed with UTI and noncompliant with initial dose of abx. Pt c/o burning with urination, malodor, and hematuria x 2 weeks. C/o left flank pain since April 2024. )     HISTORY OF PRESENT ILLNESS:  Interpreter used: No (English Preferred Language)    Lori Santos is a 72 year old female with HTN, HLD, prediabetes, schizoaffective disorder who presents with dysuria and hematuria. Had UTI diagnosed at clinic around June 30  -prescribed nitrofurantoin for 7 days, missed a few doses but eventually completed the pills. Has had persistent dysuria and hematuria with clots every time she urinates. She also has suprapubic pain at rest - 8/10  and "burning" along with L flank pain that is worse when she moves around. No h/o kidney stones, no fevers/chills, night sweats. Has lost 10 lbs within 3 weeks. At [facility] recently for psychiatric admission, currently taking mirtazepine, olanzapine, valsartan, simvastatin. No substance use.            PAST MEDICAL HISTORY:  Past Medical History:   Diagnosis Date    History of prediabetes     Hypercholesterolemia     Hypertension     Schizoaffective disorder (CMS-HCC)       Patient Active Problem List    Diagnosis Date Noted    Psychosis (CMS-HCC) 01/09/2023     SURGICAL HISTORY:  Past Surgical History:   Procedure Laterality Date    CATARACT SURGERY      CHOLECYSTECTOMY      Lump removal breast, left      Tumor removal, left thigh       ALLERGIES:  No Known Allergies      FAMILY HISTORY:  Reviewed and considered non-contributory     SOCIAL HISTORY/DETERMINANTS OF HEALTH:  Social History     Socioeconomic History    Marital status: Widowed   Tobacco Use    Smoking status: Unknown   Substance and Sexual Activity    Alcohol use: Never      VITAL SIGNS:  First Vitals [06/18/23 2136]   Temperature Heart Rate Respirations Blood pressure (BP) SpO2   97.7 F (36.5 C) 75 16 143/70 97 %     PHYSICAL EXAM:    Physical Exam  Constitutional:       General:  She is not in acute distress.     Appearance: She is not toxic-appearing.   HENT:      Head: Normocephalic and atraumatic.      Mouth/Throat:      Mouth: Mucous membranes are moist.   Abdominal:      General: Abdomen is flat.      Palpations: Abdomen is soft. There is no mass.      Tenderness: There is abdominal tenderness. There is left CVA tenderness. There is no guarding or rebound.      Hernia: No hernia is present.      Comments: Mild suprapubic tenderness. No Murphy's sign, no pain at McBurney's point   Skin:     General: Skin is warm and dry.      Coloration: Skin is not jaundiced.   Neurological:      Mental Status: She is alert.            MEDICAL DECISION MAKING:    Initial Impressions:  Lori Santos is a 72 year old female who presents with dysuria.   Vital signs and physical exam were notable for: VSS, PE (+) for L flank and suprapubic tenderness.  Differential diagnosis includes: UTI, pyelonephritis, renal stone, malignancy, constipation    Workup Review:       72 yo F with HTN, HLD, prediabetes, schizoaffective disorder who presents with persistent hematuria and dysuria after missed doses of nitrofurantoin prescribed for UTI. CT ordered for suspected renal stone vs malignancy given weight loss. UA returned with no convincing evidence of infection, also small microscopic hematuria. Given symptoms, plan to treat with antibiotic with additional coverage--prescribed cefdinir for outpatient use. Gave ceftriaxone x1 while in department. While waiting for CT, patient's family made nurse aware that patient has also been having bright red blood in her stools. CT returned with large rectal stool burden with associated stercoral proctitis. Patient was disimpacted in ED and provided bowel regimen of miralax and colace.  Patient instructed on bowel regimen, return precautions.  Patient demonstrated understanding of plan and importance of close follow-up with PCP.  Patient was discharged in the stable condition.   All questions answered.    Disposition Decision:    Discharge                                                 Patient's exam and workup were reassuring. They are stable for discharge at this time and I recommend that they pursue outpatient follow up for their complaint. Questions answered and return precautions provided.     Discharge Medication List as of 06/19/2023  8:25 AM        START taking these medications    Details   cefdinir (OMNICEF) 300 MG capsule Take 1 capsule (300 mg) by mouth 2 times daily for 7 days., Disp-14 capsule, R-0, ePrescribe      docusate sodium (COLACE) 100 MG capsule Take 1 capsule (100 mg) by mouth daily as needed for Constipation., Disp-30 capsule, R-0, ePrescribe      polyethylene glycol (GLYCOLAX) 17 GM/SCOOP powder Take 17 g by mouth daily. Mix with 4 to 8 ounces of fluid (water, juice, soda, coffee, or tea) prior to administration., Disp-255 g, R-0, ePrescribe           No discharge procedures on file.              The following work up was performed during the encounter. Orders placed after a disposition has been selected will not be listed here. A complete account of orders should be referenced elsewhere:     ED Orders (From admission, onward)      Ordered     Status Ordering Provider    06/19/23 0352  Enema  ONE TIME         Ordered SCHETTLER, JACOB HUNTER    06/19/23 0253  cefTRIAXone (ROCEPHIN) 1,000 mg in sterile water (PF) 10 mL IV  ONCE         Last MAR action: Given - by Renford Dills, Shary Key on 06/19/23 at 0318 Colman Cater    06/19/23 0303  Urine Culture  ONCE         Acknowledged [place], Braulio Kiedrowski M    06/19/23 0026  iohexol (OMNIPAQUE 350) 350 MG/ML solution 100 mL  ONCE         Last MAR action: Given - by Donia Ast on 06/19/23 at 0045 [place], Lanice Shirts    06/19/23 0026  sodium chloride 0.9 % flush 50 mL  ONCE PRN  Acknowledged Colman Cater    06/19/23 0026  sodium chloride 0.9 % flush 10 mL  ONCE PRN         Acknowledged Colman Cater    06/18/23  2318  lactated ringers bolus 1,000 mL  ONCE         Last MAR action: Completed - by Cristy Friedlander RIVERA on 06/19/23 at 0138 Nibbe, JACOB HUNTER    06/18/23 2318  INSERT AND MAINTAIN PERIPHERAL IV  ONE TIME         Acknowledged SCHETTLER, JACOB HUNTER    06/18/23 2318  Urinalysis with Culture Reflex, when indicated  ONCE         Edited Result - FINAL SCHETTLER, JACOB HUNTER    06/18/23 2318  CBC w/ Diff Lavender  ONCE         Final result SCHETTLER, JACOB HUNTER    06/18/23 2318  Comprehensive Metabolic Panel  ONCE         Final result SCHETTLER, JACOB HUNTER    06/18/23 2318  CT Abdomen And Pelvis With Contrast  ONE TIME        Comments: If the patient is female in a child-bearing age (12-50), do not proceed until pregnancy status is confirmed.    Preliminary result SCHETTLER, JACOB HUNTER             ED COURSE/PATIENT REASSESSMENTS:  "ED Course" is listed below that provides real time documentation during ER encounter and will provide further context to the MDM discussion above.    Workup Summary         Value Comment By Time      62F h/o dysuria and flank pain. Noncompliant w/ Abx for recent UTI. Hematuria, dysuria, suprapubic and flank pain. No fevers. Psych hx. Decreased PO.  Colman Cater, MD 07/10 2313      CBC w/ Diff Lavender:    WBC 5.3   RBC 4.79   Hgb 14.9   Hct 43.9   MCV 91.6   MCH 31.1   MCHC 33.9   RDW-CV 13.8   Plt Count 125(!)   MPV 10.9   Diff Type PERIPHERAL SMEAR WAS REVIEWED   Neutrophils % (A) 63.7   ANC automated 3.4   Lymphocytes % 26.7   Lymphocytes Absolute 1.4   Monocytes % 8.7   Monocytes 0.5   Eosinophils % 0.2   Eosinophils Absolute 0.0   Basophils % 0.7   Abs Basophils 0.0   RBC Morphology NORMAL   Platelet Morphology VERIFIED   Large Platelet PRESENT CBC wnl Schettler, Billy Fischer, MD 07/11 0056      Comprehensive Metabolic Panel:    Sodium 137   Potassium 3.8   Chloride 104   CO2 24   Anion Gap 9   Glucose 95   BUN 26(!)   Creatinine 0.5(!)   GFR >60   eGFR - high  estimate >60   Calcium 8.9   Total Protein 6.7   Albumin 4.0   Alkaline Phos 45   AST (SGOT) 15   ALT (SGPT) 9   Bilirubin, Tot 0.6 CMP wnl Schettler, Billy Fischer, MD 07/11 [phone]      CT prelim with liver lesions, moderate stool burden, and right mild hydronephrosis Schettler, Billy Fischer, MD 07/11 [phone]      HPI clarified: Has been having few hard stools with bright red blood for unknown time period, water intake has greatly decreased over the last few weeks Schettler, Billy Fischer, MD  07/11 0241     Leuk Esterase: NEGATIVE No obvious evidence of urinary tract infection on urinalysis, however patient is symptomatic with suprapubic pain and dysuria in the setting of hematuria.  Therefore we will treat with ceftriaxone and send urine for culture. Colman Cater, MD 07/11 0253      Hemoglobin, UA: MODERATE UA with slight bump in RBCs, likely no UTI Schettler, Billy Fischer, MD 07/11 [phone]      Sign out from [facility] to [facility]  Illness severity: Stable  Patient summary: 73F h/o dysuria and flank pain. Noncompliant w/ Abx for recent UTI. Hematuria, dysuria, suprapubic and flank pain. No fevers. Psych hx. Decreased PO.   - hematuria, no obvious UTI. Prelim CT w/ liver lesions, stool burden, mild R hydro. No stat rad yet. Treating for UTI given Sx's and sending urine Cx.  Action list: stat rad CT read, tell patient about liver lesions/CT incidentals, enema  Situation awareness: n/a Colman Cater, MD 07/11 [phone]              DIAGNOSIS:    ICD-10-CM ICD-9-CM   1. Impacted stool in rectum (CMS-HCC)  K56.41 560.32   2. Constipation, unspecified constipation type  K59.00 564.00   3. Hematuria, unspecified type  R31.9 599.70                           Schettler, Billy Fischer, MD  Resident  06/21/23 0107    ATTENDING ATTESTATION:  I evaluated the patient concurrently with the Resident.  I discussed the case with the Resident and agree with the findings and plan as documented by the Resident.  Any additions or revisions are included in  the record as necessary.    Colman Cater, MD       Colman Cater, MD  06/21/23 [phone]

## 2023-06-19 LAB — URINALYSIS WITH CULTURE REFLEX, WHEN INDICATED
Bilirubin, UA: NEGATIVE
Glucose, UA: NEGATIVE MG/DL
Ketones, UA: 20 MG/DL — AB
Leukocyte Esterase, UA: NEGATIVE
Nitrite, UA: NEGATIVE
Protein, UA: 10 MG/DL — AB
RBC, UA: 6 #/HPF — ABNORMAL HIGH (ref 0–3)
Specific Grav, UA: >1.05 — ABNORMAL HIGH (ref 1.003–1.030)
Squamous Epithelial, UA: 1 /HPF (ref 0–10)
UA Cult: NEGATIVE
Urobilinogen, UA: <2 MG/DL (ref <2)
WBC, UA: 4 #/HPF (ref 0–5)
pH, UA: 6.5 (ref 5.0–8.0)

## 2023-06-19 LAB — CBC WITH DIFF, BLOOD
ANC automated: 3.4 10*3/uL (ref 2.0–8.1)
Basophils %: 0.7 %
Basophils Absolute: 0 10*3/uL (ref 0.0–0.2)
Eosinophils %: 0.2 %
Eosinophils Absolute: 0 10*3/uL (ref 0.0–0.5)
Hematocrit: 43.9 % (ref 34.0–44.0)
Hgb: 14.9 G/DL (ref 11.5–15.0)
Lymphocytes %: 26.7 %
Lymphocytes Absolute: 1.4 10*3/uL (ref 0.9–3.3)
MCH: 31.1 PG (ref 27.0–33.5)
MCHC: 33.9 G/DL (ref 32.0–35.5)
MCV: 91.6 FL (ref 81.5–97.0)
MPV: 10.9 FL (ref 7.2–11.7)
Monocytes %: 8.7 %
Monocytes Absolute: 0.5 10*3/uL (ref 0.0–0.8)
Neutrophils % (A): 63.7 %
PLT Count: 125 10*3/uL — ABNORMAL LOW (ref 150–400)
RBC Morphology: NORMAL
RBC: 4.79 10*6/uL (ref 3.70–5.00)
RDW-CV: 13.8 % (ref 11.6–14.4)
White Bld Cell Count: 5.3 10*3/uL (ref 4.0–10.5)

## 2023-06-19 LAB — COMPREHENSIVE METABOLIC PANEL, BLOOD
ALT: 9 U/L (ref 7–52)
AST: 15 U/L (ref 13–39)
Albumin: 4 G/DL (ref 3.7–5.3)
Alk Phos: 45 U/L (ref 34–104)
BUN: 26 mg/dL — ABNORMAL HIGH (ref 7–25)
Bilirubin, Total: 0.6 mg/dL (ref 0.0–1.4)
CO2: 24 mmol/L (ref 21–31)
Calcium: 8.9 mg/dL (ref 8.6–10.3)
Chloride: 104 mmol/L (ref 98–107)
Creat: 0.5 mg/dL — ABNORMAL LOW (ref 0.6–1.2)
Electrolyte Balance: 9 mmol/L (ref 2–12)
Glucose: 95 mg/dL (ref 85–125)
Potassium: 3.8 mmol/L (ref 3.5–5.1)
Protein, Total: 6.7 G/DL (ref 6.0–8.3)
Sodium: 137 mmol/L (ref 136–145)
eGFR - high estimate: >60 (ref >59)
eGFR - low estimate: >60 (ref >59)

## 2023-06-19 MED ORDER — SODIUM CHLORIDE 0.9 % IJ SOLN (CUSTOM)
50.0000 mL | Freq: Once | INTRAMUSCULAR | Status: DC | PRN
Start: 2023-06-19 — End: 2023-06-19

## 2023-06-19 MED ORDER — FLEET ADULT ENEMA 7-19 GM/118ML RE ENEM
1.0000 | ENEMA | Freq: Once | RECTAL | Status: DC
Start: 2023-06-19 — End: 2023-06-19

## 2023-06-19 MED ORDER — SODIUM CHLORIDE FLUSH 0.9 % IV SOLN
10.0000 mL | Freq: Once | INTRAVENOUS | Status: DC | PRN
Start: 2023-06-19 — End: 2023-06-19

## 2023-06-19 MED ORDER — POLYETHYLENE GLYCOL 3350 OR POWD
17.0000 g | Freq: Every day | ORAL | 0 refills | Status: AC
Start: 2023-06-19 — End: ?

## 2023-06-19 MED ORDER — IOHEXOL 350 MG/ML CO SOLN
100.0000 mL | Freq: Once | Status: AC
Start: 2023-06-19 — End: 2023-06-19
  Administered 2023-06-19: 100 mL via INTRAVENOUS

## 2023-06-19 MED ORDER — STERILE WATER FOR INJECTION IJ SOLN
1000.0000 mg | Freq: Once | INTRAMUSCULAR | Status: AC
Start: 2023-06-19 — End: 2023-06-19
  Administered 2023-06-19: 1000 mg via INTRAVENOUS
  Filled 2023-06-19: qty 1000

## 2023-06-19 MED ORDER — DOCUSATE SODIUM 100 MG OR CAPS
100.0000 mg | ORAL_CAPSULE | Freq: Every day | ORAL | 0 refills | Status: AC | PRN
Start: 2023-06-19 — End: ?

## 2023-06-19 MED ORDER — FLEET ADULT ENEMA 7-19 GM/118ML RE ENEM
1.0000 | ENEMA | Freq: Every day | RECTAL | Status: DC | PRN
Start: 2023-06-19 — End: 2023-06-19

## 2023-06-19 MED ORDER — FLEET ADULT ENEMA 7-19 GM/118ML RE ENEM
2.0000 | ENEMA | Freq: Once | RECTAL | Status: DC
Start: 2023-06-19 — End: 2023-06-19

## 2023-06-19 MED ORDER — CEFDINIR 300 MG OR CAPS
300.0000 mg | ORAL_CAPSULE | Freq: Two times a day (BID) | ORAL | 0 refills | Status: AC
Start: 2023-06-19 — End: 2023-06-26

## 2023-06-19 NOTE — ED Notes (Signed)
Spoke with Lori Santos in lab regarding urine culture ordered. Per lab they will add on and do not need another urine sample provided.

## 2023-06-19 NOTE — ED Notes (Signed)
RTS: IV fluids complete. Pt aware of pending urine sample needed. Daughter states that Pt had a bowl movement with bright red blood. MD Melvyn Neth and MD Schettler made aware. Pt in NAD at this time. Stable to wait in triage at this time

## 2023-06-19 NOTE — Discharge Instructions (Addendum)
Thank you for allowing [place] to care for you today.  You were seen for blood in the urine and stool. We evaluated with CT, which showed impacted stool in the rectum, which we disimpacted. It also showed back up of urine into the R kidney, which is likely due to the stool burden, but may be related to renal stone or infection. We are prescribing antibiotics for your urinary symptoms - take as directed. We are also prescribing miralax and colace for constipation.  Take until having regular soft bowel movements.    Another important finding:    Follow up with your primary care doctor regarding the following incidental findings on your CT scan:    Multiple hypoattenuating lesions throughout the liver.     Please return immediately for constipation >7 days, severe abdominal pain, continued rectal bleeding or hematuria despite antibiotic therapy, also shortness of breath, chest pain, severe bleeding, severe pain, inability to eat or drink, lightheadedness or any other or new or worsening symptoms.

## 2023-06-19 NOTE — ED Notes (Signed)
Patient is awake and alert x 4. Vital signs are stable and afebrile. Patient denies any pain or discomfort at this time. Respiratory rate is even and unlabored. Discharge care provided to patient with verbalization of understanding instructions. Patient is free from injury and is in no acute distress. Discharge off the unit in stable condition.

## 2023-06-23 LAB — URINE CULTURE
Culture Result: 25000 — AB
Culture Result: 60000 — AB
Culture Result: >100000 — AB
